# Patient Record
Sex: Female | Born: 1970 | Race: White | Marital: Married | Smoking: Never smoker
Health system: Southern US, Community
[De-identification: ages and names within clinical notes are randomized; demographics above are authoritative.]

## PROBLEM LIST (undated history)

## (undated) DIAGNOSIS — K589 Irritable bowel syndrome without diarrhea: Secondary | ICD-10-CM

## (undated) DIAGNOSIS — F329 Major depressive disorder, single episode, unspecified: Secondary | ICD-10-CM

## (undated) DIAGNOSIS — F32A Depression, unspecified: Secondary | ICD-10-CM

## (undated) DIAGNOSIS — C801 Malignant (primary) neoplasm, unspecified: Secondary | ICD-10-CM

## (undated) HISTORY — PX: COLON SURGERY: SHX602

## (undated) HISTORY — PX: CHOLECYSTECTOMY: SHX55

## (undated) HISTORY — PX: APPENDECTOMY: SHX54

---

## 2013-10-12 ENCOUNTER — Other Ambulatory Visit: Payer: Self-pay | Admitting: Family Medicine

## 2013-10-12 DIAGNOSIS — R1084 Generalized abdominal pain: Secondary | ICD-10-CM

## 2013-10-16 ENCOUNTER — Ambulatory Visit
Admission: RE | Admit: 2013-10-16 | Discharge: 2013-10-16 | Disposition: A | Discharge status: Home / Self Care | Payer: BC Managed Care – PPO | Source: Ambulatory Visit | Attending: Family Medicine | Admitting: Family Medicine

## 2013-10-16 DIAGNOSIS — R1084 Generalized abdominal pain: Secondary | ICD-10-CM

## 2013-10-16 MED ORDER — IOHEXOL 300 MG/ML  SOLN
100.0000 mL | Freq: Once | INTRAMUSCULAR | Status: AC | PRN
  Administered 2013-10-16: 100 mL via INTRAVENOUS

## 2015-07-22 DIAGNOSIS — F331 Major depressive disorder, recurrent, moderate: Secondary | ICD-10-CM | POA: Diagnosis not present

## 2015-07-22 DIAGNOSIS — L300 Nummular dermatitis: Secondary | ICD-10-CM | POA: Diagnosis not present

## 2016-09-16 DIAGNOSIS — F331 Major depressive disorder, recurrent, moderate: Secondary | ICD-10-CM | POA: Diagnosis not present

## 2016-11-27 ENCOUNTER — Encounter (HOSPITAL_BASED_OUTPATIENT_CLINIC_OR_DEPARTMENT_OTHER): Payer: Self-pay | Admitting: *Deleted

## 2016-11-27 ENCOUNTER — Emergency Department (HOSPITAL_BASED_OUTPATIENT_CLINIC_OR_DEPARTMENT_OTHER): Payer: BLUE CROSS/BLUE SHIELD

## 2016-11-27 ENCOUNTER — Emergency Department (HOSPITAL_BASED_OUTPATIENT_CLINIC_OR_DEPARTMENT_OTHER)
Admission: EM | Admit: 2016-11-27 | Discharge: 2016-11-28 | Disposition: A | Discharge status: Home / Self Care | Payer: BLUE CROSS/BLUE SHIELD | Attending: Emergency Medicine | Admitting: Emergency Medicine

## 2016-11-27 DIAGNOSIS — M5417 Radiculopathy, lumbosacral region: Secondary | ICD-10-CM

## 2016-11-27 DIAGNOSIS — Y929 Unspecified place or not applicable: Secondary | ICD-10-CM | POA: Insufficient documentation

## 2016-11-27 DIAGNOSIS — Y9301 Activity, walking, marching and hiking: Secondary | ICD-10-CM | POA: Insufficient documentation

## 2016-11-27 DIAGNOSIS — S79912A Unspecified injury of left hip, initial encounter: Secondary | ICD-10-CM | POA: Diagnosis not present

## 2016-11-27 DIAGNOSIS — S3992XA Unspecified injury of lower back, initial encounter: Secondary | ICD-10-CM | POA: Diagnosis present

## 2016-11-27 DIAGNOSIS — W108XXA Fall (on) (from) other stairs and steps, initial encounter: Secondary | ICD-10-CM | POA: Diagnosis not present

## 2016-11-27 DIAGNOSIS — Z79899 Other long term (current) drug therapy: Secondary | ICD-10-CM | POA: Insufficient documentation

## 2016-11-27 DIAGNOSIS — Y998 Other external cause status: Secondary | ICD-10-CM | POA: Insufficient documentation

## 2016-11-27 DIAGNOSIS — M5416 Radiculopathy, lumbar region: Secondary | ICD-10-CM | POA: Insufficient documentation

## 2016-11-27 DIAGNOSIS — M25552 Pain in left hip: Secondary | ICD-10-CM | POA: Diagnosis not present

## 2016-11-27 DIAGNOSIS — W19XXXA Unspecified fall, initial encounter: Secondary | ICD-10-CM

## 2016-11-27 HISTORY — DX: Major depressive disorder, single episode, unspecified: F32.9

## 2016-11-27 HISTORY — DX: Depression, unspecified: F32.A

## 2016-11-27 MED ORDER — OXYCODONE-ACETAMINOPHEN 5-325 MG PO TABS
1.0000 | ORAL_TABLET | ORAL | Status: DC | PRN
  Administered 2016-11-27: 1 via ORAL
  Filled 2016-11-27: qty 1

## 2016-11-27 MED ORDER — ONDANSETRON 4 MG PO TBDP
4.0000 mg | ORAL_TABLET | Freq: Once | ORAL | Status: AC
  Administered 2016-11-27: 4 mg via ORAL
  Filled 2016-11-27: qty 1

## 2016-11-27 NOTE — ED Triage Notes (Signed)
She fell down stairs 4 days ago. Pain got worse yesterday. She had to drive a lot today. She is ambulatory to triage and feels better to stand.

## 2016-11-28 MED ORDER — IBUPROFEN 600 MG PO TABS: 600.0000 mg | ORAL_TABLET | Freq: Three times a day (TID) | ORAL | 0 refills | Status: AC | PRN

## 2016-11-28 NOTE — ED Notes (Signed)
Pt verbalizes understanding of d/c instructions and denies any further needs at this time. 

## 2016-11-28 NOTE — ED Provider Notes (Signed)
[facility] Provider Note   CSN: [id number] Arrival date & time: 11/27/16  2030     History   Chief Complaint Chief Complaint  Patient presents with  . Fall    HPI Anita Carr is a 46 y.o. female.  The history is provided by the patient.  Fall  This is a new problem. The current episode started more than 2 days ago. Pertinent negatives include no chest pain, no abdominal pain and no headaches. Exacerbated by: sitting. The symptoms are relieved by walking.  pt reports accidentally falling over 4 days ago She was walking to bathroom after waking up and missed door and fell down steps injuring low back No head injury No LOC No cp/abd pain No neck pain   Past Medical History:  Diagnosis Date  . Depression     There are no active problems to display for this patient.   Past Surgical History:  Procedure Laterality Date  . CHOLECYSTECTOMY    . COLON SURGERY      OB History    No data available       Home Medications    Prior to Admission medications   Medication Sig Start Date End Date Taking? Authorizing Provider  BuPROPion HCl (WELLBUTRIN PO) Take by mouth.   Yes [provider]  ibuprofen (ADVIL,MOTRIN) 600 MG tablet Take 1 tablet (600 mg total) by mouth every 8 (eight) hours as needed. 11/28/16   Ripley Fraise, MD    Family History No family history on file.  Social History Social History  Substance Use Topics  . Smoking status: Never Smoker  . Smokeless tobacco: Never Used  . Alcohol use Yes     Allergies   Patient has no known allergies.   Review of Systems Review of Systems  Constitutional: Negative for fever.  Cardiovascular: Negative for chest pain.  Gastrointestinal: Negative for abdominal pain.  Musculoskeletal: Positive for back pain. Negative for neck pain.  Neurological: Negative for weakness and headaches.  All other systems reviewed and are negative.    Physical Exam Updated Vital Signs BP (!) 136/94  (BP Location: Left Arm)   Pulse 81   Temp 98.1 F (36.7 C) (Oral)   Resp 18   Ht 1.626 m (5\' 4" )   Wt 56.7 kg (125 lb)   LMP 11/23/2016   SpO2 99%   BMI 21.46 kg/m   Physical Exam CONSTITUTIONAL: Well developed/well nourished HEAD: Normocephalic/atraumatic EYES: EOMI/PERRL ENMT: Mucous membranes moist NECK: supple no meningeal signs SPINE/BACK:entire spine nontender, No bruising/crepitance/stepoffs noted to spine Mild paraspinal tenderness in lumbar region, no bruising noted CV: S1/S2 noted, no murmurs/rubs/gallops noted LUNGS: Lungs are clear to auscultation bilaterally, no apparent distress ABDOMEN: soft, nontender, no rebound or guarding, bowel sounds noted throughout abdomen GU:no cva tenderness NEURO: Pt is awake/alert/appropriate, moves all extremitiesx4.  No facial droop.  Ambulates without difficulty EXTREMITIES: pulses normal/equal, full ROM, All extremities/joints palpated/ranged and nontender SKIN: warm, color normal PSYCH: no abnormalities of mood noted, alert and oriented to situation   ED Treatments / Results  Labs (all labs ordered are listed, but only abnormal results are displayed) Labs Reviewed - No data to display  EKG  EKG Interpretation None       Radiology Dg Hip Unilat With Pelvis 2-3 Views Left  Result Date: 11/27/2016 CLINICAL DATA:  Fall with pain in the left hip EXAM: DG HIP (WITH OR WITHOUT PELVIS) 2-3V LEFT COMPARISON:  CT 10/16/2013 FINDINGS: SI joints are patent bilaterally. The pubic symphysis  is intact. The rami are within normal limits. Small calcified pelvic phleboliths. No fracture or malalignment is seen. Nonspecific soft tissue calcification adjacent to the greater trochanter. IMPRESSION: No definite acute osseous abnormality Electronically Signed   By: Donavan Foil M.D.   On: 11/27/2016 21:36    Procedures Procedures (including critical care time)  Medications Ordered in ED Medications  oxyCODONE-acetaminophen  (PERCOCET/ROXICET) 5-325 MG per tablet 1 tablet (1 tablet Oral Given 11/27/16 2055)  ondansetron (ZOFRAN-ODT) disintegrating tablet 4 mg (4 mg Oral Given 11/27/16 2338)     Initial Impression / Assessment and Plan / ED Course  I have reviewed the triage vital signs and the nursing notes.  Pertinent maging results that were available during my care of the patient were reviewed by me and considered in my medical decision making (see chart for details).     Pt reports back pain with radiation into left LE Likely radiculopathy She is ambulatory Advised f/u as outpatient We discussed return precautions   Final Clinical Impressions(s) / ED Diagnoses   Final diagnoses:  Fall, initial encounter  Lumbosacral radiculopathy    New Prescriptions New Prescriptions   IBUPROFEN (ADVIL,MOTRIN) 600 MG TABLET    Take 1 tablet (600 mg total) by mouth every 8 (eight) hours as needed.     Ripley Fraise, MD 11/28/16 [phone]

## 2016-11-28 NOTE — Discharge Instructions (Signed)

## 2016-11-28 NOTE — ED Notes (Signed)
Pt vomiting from percocet, will get zofran and move pt to hall bed to lie down

## 2016-11-28 NOTE — ED Notes (Signed)
Nausea has resolved, pt sipping on ginger ale

## 2016-11-28 NOTE — ED Notes (Signed)
Pt states she drove herself to ED. Pt informed she would need to call a ride to pick her up due to having took a percocet and is not allowed to drive after taking them. Pt is phone calling ride now.Marland Kitchen

## 2017-05-14 DIAGNOSIS — R5383 Other fatigue: Secondary | ICD-10-CM | POA: Diagnosis not present

## 2017-05-14 DIAGNOSIS — R112 Nausea with vomiting, unspecified: Secondary | ICD-10-CM | POA: Diagnosis not present

## 2017-05-14 DIAGNOSIS — F331 Major depressive disorder, recurrent, moderate: Secondary | ICD-10-CM | POA: Diagnosis not present

## 2017-05-14 DIAGNOSIS — H539 Unspecified visual disturbance: Secondary | ICD-10-CM | POA: Diagnosis not present

## 2017-06-05 ENCOUNTER — Encounter (HOSPITAL_BASED_OUTPATIENT_CLINIC_OR_DEPARTMENT_OTHER): Payer: Self-pay | Admitting: Emergency Medicine

## 2017-06-05 ENCOUNTER — Other Ambulatory Visit: Payer: Self-pay

## 2017-06-05 ENCOUNTER — Emergency Department (HOSPITAL_BASED_OUTPATIENT_CLINIC_OR_DEPARTMENT_OTHER): Payer: BLUE CROSS/BLUE SHIELD

## 2017-06-05 ENCOUNTER — Emergency Department (HOSPITAL_BASED_OUTPATIENT_CLINIC_OR_DEPARTMENT_OTHER)
Admission: EM | Admit: 2017-06-05 | Discharge: 2017-06-05 | Disposition: A | Discharge status: Home / Self Care | Payer: BLUE CROSS/BLUE SHIELD | Attending: Emergency Medicine | Admitting: Emergency Medicine

## 2017-06-05 DIAGNOSIS — R112 Nausea with vomiting, unspecified: Secondary | ICD-10-CM

## 2017-06-05 DIAGNOSIS — R1110 Vomiting, unspecified: Secondary | ICD-10-CM | POA: Diagnosis not present

## 2017-06-05 DIAGNOSIS — K529 Noninfective gastroenteritis and colitis, unspecified: Secondary | ICD-10-CM | POA: Diagnosis not present

## 2017-06-05 HISTORY — DX: Malignant (primary) neoplasm, unspecified: C80.1

## 2017-06-05 HISTORY — DX: Irritable bowel syndrome without diarrhea: K58.9

## 2017-06-05 LAB — CBC
HCT: 35.6 % — ABNORMAL LOW (ref 36.0–46.0)
HEMOGLOBIN: 12.6 g/dL (ref 12.0–15.0)
MCH: 33.5 pg (ref 26.0–34.0)
MCHC: 35.4 g/dL (ref 30.0–36.0)
MCV: 94.7 fL (ref 78.0–100.0)
Platelets: 179 10*3/uL (ref 150–400)
RBC: 3.76 MIL/uL — AB (ref 3.87–5.11)
RDW: 11.5 % (ref 11.5–15.5)
WBC: 4.6 10*3/uL (ref 4.0–10.5)

## 2017-06-05 LAB — URINALYSIS, ROUTINE W REFLEX MICROSCOPIC
BILIRUBIN URINE: NEGATIVE
Glucose, UA: NEGATIVE mg/dL
Hgb urine dipstick: NEGATIVE
Ketones, ur: 15 mg/dL — AB
Leukocytes, UA: NEGATIVE
Nitrite: NEGATIVE
Protein, ur: NEGATIVE mg/dL
pH: 6 (ref 5.0–8.0)

## 2017-06-05 LAB — COMPREHENSIVE METABOLIC PANEL
ALK PHOS: 44 U/L (ref 38–126)
ALT: 33 U/L (ref 14–54)
ANION GAP: 12 (ref 5–15)
AST: 62 U/L — ABNORMAL HIGH (ref 15–41)
Albumin: 4.4 g/dL (ref 3.5–5.0)
BILIRUBIN TOTAL: 1.1 mg/dL (ref 0.3–1.2)
BUN: 9 mg/dL (ref 6–20)
CALCIUM: 8.8 mg/dL — AB (ref 8.9–10.3)
CO2: 22 mmol/L (ref 22–32)
Chloride: 101 mmol/L (ref 101–111)
Creatinine, Ser: 0.56 mg/dL (ref 0.44–1.00)
GFR calc non Af Amer: >60 mL/min (ref >60)
Glucose, Bld: 110 mg/dL — ABNORMAL HIGH (ref 65–99)
POTASSIUM: 3.3 mmol/L — AB (ref 3.5–5.1)
Sodium: 135 mmol/L (ref 135–145)
Total Protein: 7.9 g/dL (ref 6.5–8.1)

## 2017-06-05 LAB — PREGNANCY, URINE: PREG TEST UR: NEGATIVE

## 2017-06-05 LAB — LIPASE, BLOOD: Lipase: 24 U/L (ref 11–51)

## 2017-06-05 MED ORDER — POTASSIUM CHLORIDE CRYS ER 20 MEQ PO TBCR
40.0000 meq | EXTENDED_RELEASE_TABLET | Freq: Once | ORAL | Status: AC
  Administered 2017-06-05: 40 meq via ORAL
  Filled 2017-06-05: qty 2

## 2017-06-05 MED ORDER — ONDANSETRON 4 MG PO TBDP: 4.0000 mg | ORAL_TABLET | Freq: Three times a day (TID) | ORAL | 0 refills | Status: AC | PRN

## 2017-06-05 MED ORDER — FAMOTIDINE IN NACL 20-0.9 MG/50ML-% IV SOLN
20.0000 mg | Freq: Once | INTRAVENOUS | Status: AC
Start: 2017-06-05 — End: 2017-06-05
  Administered 2017-06-05: 20 mg via INTRAVENOUS
  Filled 2017-06-05: qty 50

## 2017-06-05 MED ORDER — ONDANSETRON HCL 4 MG/2ML IJ SOLN
4.0000 mg | Freq: Once | INTRAMUSCULAR | Status: AC | PRN
  Administered 2017-06-05: 4 mg via INTRAVENOUS
  Filled 2017-06-05: qty 2

## 2017-06-05 MED ORDER — IOPAMIDOL (ISOVUE-300) INJECTION 61%
100.0000 mL | Freq: Once | INTRAVENOUS | Status: AC | PRN
Start: 2017-06-05 — End: 2017-06-05
  Administered 2017-06-05: 100 mL via INTRAVENOUS

## 2017-06-05 MED ORDER — ONDANSETRON HCL 4 MG/2ML IJ SOLN
4.0000 mg | Freq: Once | INTRAMUSCULAR | Status: AC
  Administered 2017-06-05: 4 mg via INTRAVENOUS
  Filled 2017-06-05: qty 2

## 2017-06-05 NOTE — ED Notes (Addendum)
Pt returned from [facility]. C/o continued nausea. Zofran ordered per protocol. Delay explained

## 2017-06-05 NOTE — ED Provider Notes (Signed)
[facility] EMERGENCY DEPARTMENT Provider Note   CSN: [id number] Arrival date & time: 06/05/17  1220     History   Chief Complaint Chief Complaint  Patient presents with  . Nausea  . Emesis    HPI Anita Carr is a 47 y.o. female.  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  She states that for the past 2 years or so she has had emesis intermittently.  She states that for the past week however she has been experiencing at least 3 episodes of nonbloody nonbilious emesis every morning with some improvement throughout the day.  She states that she will sometimes vomit after meals or in the middle of the night.  She notes that these symptoms are usually preceded by a feeling of anxiety and associated with "shaking all over ".  The shaking will improve after she finishes vomiting.  She denies fevers, chills, chest pain, shortness of breath, abdominal pain, or urinary symptoms.  She does have diarrhea and constipation intermittently due to her IBS but she states this is no worse than usual.  She has tried [employer] over-the-counter with some improvement in her symptoms.  She states that her primary care physician has "run blood work twice "which was all found to be normal and she has an appointment scheduled to see gastroenterology on Tuesday.  She was given Zofran in the ED which she states was helpful.  He denies unintentional weight loss.  She notes she occasionally does have sweating at night but she thinks this is related to menopause.  The history is provided by the patient.    Past Medical History:  Diagnosis Date  . Cancer ([place])   . Depression   . IBS (irritable bowel syndrome)     There are no active problems to display for this patient.   Past Surgical History:  Procedure Laterality Date  . APPENDECTOMY    .  CHOLECYSTECTOMY    . COLON SURGERY      OB History    No data available       Home Medications    Prior to Admission medications   Medication Sig Start Date End Date Taking? Authorizing Provider  BuPROPion HCl (WELLBUTRIN PO) Take by mouth.    [provider]  ibuprofen (ADVIL,MOTRIN) 600 MG tablet Take 1 tablet (600 mg total) by mouth every 8 (eight) hours as needed. 11/28/16   Ripley Fraise, MD  ondansetron (ZOFRAN ODT) 4 MG disintegrating tablet Take 1 tablet (4 mg total) by mouth every 8 (eight) hours as needed for up to 4 days for nausea or vomiting. 06/05/17 06/09/17  Renita Papa, PA-C    Family History No family history on file.  Social History Social History   Tobacco Use  . Smoking status: Never Smoker  . Smokeless tobacco: Never Used  Substance Use Topics  . Alcohol use: Yes  . Drug use: No     Allergies   Patient has no known allergies.   Review of Systems Review of Systems  Constitutional: Negative for chills and fever.  Respiratory: Negative for shortness of breath.   Cardiovascular: Negative for chest pain.  Gastrointestinal: Positive for nausea and vomiting. Negative for abdominal pain.  Psychiatric/Behavioral: The patient is nervous/anxious.   All other systems reviewed and are negative.    Physical Exam Updated Vital Signs BP (!) 142/98 (BP Location: Right Arm)  Pulse 80   Temp 98.2 F (36.8 C) (Oral)   Resp 16   Ht 5\' 4"  (1.626 m)   Wt 54.4 kg (120 lb)   LMP 05/17/2017 Comment: neg upreg  SpO2 100%   BMI 20.60 kg/m   Physical Exam  Constitutional: She appears well-developed and well-nourished. No distress.  Resting in bed, anxious in appearance with slight shaking  HENT:  Head: Normocephalic and atraumatic.  Eyes: Conjunctivae are normal. Right eye exhibits no discharge. Left eye exhibits no discharge.  Neck: Normal range of motion. Neck supple. No JVD present. No tracheal deviation present.  Cardiovascular: Normal  rate, regular rhythm and normal heart sounds.  Pulmonary/Chest: Effort normal and breath sounds normal.  Abdominal: Soft. Bowel sounds are normal. She exhibits no distension and no mass. There is no tenderness. There is no guarding.  Well-healed midline surgical incision, no CVA tenderness, bowel sounds active and present throughout.  Musculoskeletal: She exhibits no edema.  Neurological: She is alert.  Skin: Skin is warm and dry. No erythema.  Psychiatric: She has a normal mood and affect. Her behavior is normal.  Nursing note and vitals reviewed.    ED Treatments / Results  Labs (all labs ordered are listed, but only abnormal results are displayed) Labs Reviewed  COMPREHENSIVE METABOLIC PANEL - Abnormal; Notable for the following components:      Result Value   Potassium 3.3 (*)    Glucose, Bld 110 (*)    Calcium 8.8 (*)    AST 62 (*)    All other components within normal limits  CBC - Abnormal; Notable for the following components:   RBC 3.76 (*)    HCT 35.6 (*)    All other components within normal limits  URINALYSIS, ROUTINE W REFLEX MICROSCOPIC - Abnormal; Notable for the following components:   Specific Gravity, Urine >1.030 (*)    Ketones, ur 15 (*)    All other components within normal limits  LIPASE, BLOOD  PREGNANCY, URINE    EKG  EKG Interpretation None       Radiology Ct Abdomen Pelvis W Contrast  Result Date: 06/05/2017 CLINICAL DATA:  Patient with nausea and vomiting. History of carcinoid tumor with prior partial colectomy. EXAM: CT ABDOMEN AND PELVIS WITH CONTRAST TECHNIQUE: Multidetector CT imaging of the abdomen and pelvis was performed using the standard protocol following bolus administration of intravenous contrast. CONTRAST:  171mL ISOVUE-300 IOPAMIDOL (ISOVUE-300) INJECTION 61% COMPARISON:  CT abdomen pelvis 10/16/2013. FINDINGS: Lower chest: Normal heart size. Dependent atelectasis/scarring left lower lobe. Hepatobiliary: Liver is diffusely low in  attenuation suggestive of steatosis. No intrahepatic or extrahepatic biliary ductal dilatation. Patient status post cholecystectomy. Pancreas: Unremarkable Spleen: Unremarkable Adrenals/Urinary Tract: Adrenal glands are normal. Kidneys enhance symmetrically with contrast. No hydronephrosis. Urinary bladder is unremarkable. Stomach/Bowel: There is wall thickening of the sigmoid colon with associated surrounding fat stranding. Additionally there is wall thickening of the transverse colon. Postsurgical changes proximal ascending colon. No evidence for small bowel obstruction. No free fluid or free intraperitoneal air. Vascular/Lymphatic: Tortuous yet normal caliber abdominal aorta. No retroperitoneal lymphadenopathy. Reproductive: Probable fibroid uterus. Adnexal structures unremarkable. Reflux of contrast into the left gonadal vein. Other: None. Musculoskeletal: Lumbar spine degenerative changes. No aggressive or acute appearing osseous lesions. IMPRESSION: 1. Marked wall thickening of the transverse, descending and sigmoid colon most compatible with colitis. Electronically Signed   By: Lovey Newcomer M.D.   On: 06/05/2017 16:34    Procedures Procedures (including critical care time)  Medications Ordered in  ED Medications  ondansetron (ZOFRAN) injection 4 mg (4 mg Intravenous Given 06/05/17 1358)  iopamidol (ISOVUE-300) 61 % injection 100 mL (100 mLs Intravenous Contrast Given 06/05/17 1544)  ondansetron (ZOFRAN) injection 4 mg (4 mg Intravenous Given 06/05/17 1604)  famotidine (PEPCID) IVPB 20 mg premix (0 mg Intravenous Stopped 06/05/17 1704)  potassium chloride SA (K-DUR,KLOR-CON) CR tablet 40 mEq (40 mEq Oral Given 06/05/17 1708)     Initial Impression / Assessment and Plan / ED Course  I have reviewed the triage vital signs and the nursing notes.  Pertinent labs & imaging results that were available during my care of the patient were reviewed by me and considered in my medical decision making (see  chart for details).     Patient presents with nausea and vomiting for 1 year which has worsened over the past week and frequency.  She is afebrile, vital signs are stable. Abdomen is entirely nontender and benign on examination. She is anxious but nontoxic in appearance.  Lab work shows no leukocytosis, very mild hypokalemia, potassium was replenished in the ED.  This is likely consistent with her vomiting.  Remainder of lab work is unremarkable.  UA not concerning for UTI or nephrolithiasis.  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    CT scan shows marked wall thickening of the transverse, descending, and sigmoid colon compatible with colitis.  In the absence of significant diarrhea, constipation, fevers, or abdominal pain I do not think this is infectious in etiology.  She will continue taking her home IBS medications.  She has improved with Zofran and is tolerating p.o. food and fluids.  She is resting comfortably, ambulatory without difficulty.  She states she feels comfortable with discharge home.  She has follow-up with GI in 3 days.  I doubt obstruction, perforation, appendicitis, or other acute surgical abdominal pathology.  I encouraged the patient to keep her follow-up appointment with GI.  Will discharge with Zofran for nausea.  Discussed bland diet and advancing diet slowly.  Discussed indications for return to the ED. Pt verbalized understanding of and agreement with plan and is safe for discharge home at this time.  She has no complaints prior to discharge.  Final Clinical Impressions(s) / ED Diagnoses   Final diagnoses:  Non-intractable vomiting with nausea, unspecified vomiting type  Colitis    ED Discharge Orders        Ordered    ondansetron (ZOFRAN ODT) 4 MG disintegrating tablet  Every 8 hours PRN     06/05/17 1658         Renita Papa, PA-C 06/05/17 1847    Little, Wenda Overland, MD 06/09/17 2033

## 2017-06-05 NOTE — ED Notes (Signed)
Patient in CT at this time.

## 2017-06-05 NOTE — ED Notes (Signed)
ED Provider at bedside. 

## 2017-06-05 NOTE — ED Notes (Signed)
Pt c/o nausea. Raul Del, [place] notified

## 2017-06-05 NOTE — ED Notes (Signed)
Pt reports feeling like she is having a panic attack due to continuously vomiting. Pt has calm demeanor but shaking on stretcher. Pt placed on monitor

## 2017-06-05 NOTE — ED Triage Notes (Signed)
N/V x 3 days. Denies pain.

## 2017-06-05 NOTE — Discharge Instructions (Signed)
1. Medications Take Zofran as needed for nausea.  Wait around 20 minutes before eating or drinking after taking this medication. 2. Treatment: rest, drink plenty of fluids, advance diet slowly.  Start with water and broth then advance to bland foods that will not upset your stomach such as crackers, mashed potatoes, and peanut butter. 3. Follow Up: Please followup with your gastroenterologist in 3 days as scheduled for discussion of your diagnoses and further evaluation after today's visit; if you do not have a primary care doctor use the resource guide provided to find one; Please return to the ER for persistent vomiting, high fevers or worsening symptoms

## 2017-06-08 DIAGNOSIS — Z789 Other specified health status: Secondary | ICD-10-CM | POA: Diagnosis not present

## 2017-06-08 DIAGNOSIS — K529 Noninfective gastroenteritis and colitis, unspecified: Secondary | ICD-10-CM | POA: Diagnosis not present

## 2017-06-08 DIAGNOSIS — R197 Diarrhea, unspecified: Secondary | ICD-10-CM | POA: Diagnosis not present

## 2017-06-08 DIAGNOSIS — R933 Abnormal findings on diagnostic imaging of other parts of digestive tract: Secondary | ICD-10-CM | POA: Diagnosis not present

## 2017-06-28 DIAGNOSIS — K449 Diaphragmatic hernia without obstruction or gangrene: Secondary | ICD-10-CM | POA: Diagnosis not present

## 2017-06-28 DIAGNOSIS — R933 Abnormal findings on diagnostic imaging of other parts of digestive tract: Secondary | ICD-10-CM | POA: Diagnosis not present

## 2017-06-28 DIAGNOSIS — K228 Other specified diseases of esophagus: Secondary | ICD-10-CM | POA: Diagnosis not present

## 2017-06-28 DIAGNOSIS — K5730 Diverticulosis of large intestine without perforation or abscess without bleeding: Secondary | ICD-10-CM | POA: Diagnosis not present

## 2017-06-28 DIAGNOSIS — K229 Disease of esophagus, unspecified: Secondary | ICD-10-CM | POA: Diagnosis not present

## 2017-06-28 DIAGNOSIS — R197 Diarrhea, unspecified: Secondary | ICD-10-CM | POA: Diagnosis not present

## 2017-06-28 DIAGNOSIS — K2930 Chronic superficial gastritis without bleeding: Secondary | ICD-10-CM | POA: Diagnosis not present

## 2017-07-01 DIAGNOSIS — K228 Other specified diseases of esophagus: Secondary | ICD-10-CM | POA: Diagnosis not present

## 2017-07-01 DIAGNOSIS — R197 Diarrhea, unspecified: Secondary | ICD-10-CM | POA: Diagnosis not present

## 2017-07-01 DIAGNOSIS — K2930 Chronic superficial gastritis without bleeding: Secondary | ICD-10-CM | POA: Diagnosis not present

## 2017-07-19 DIAGNOSIS — R197 Diarrhea, unspecified: Secondary | ICD-10-CM | POA: Diagnosis not present

## 2017-07-19 DIAGNOSIS — Z789 Other specified health status: Secondary | ICD-10-CM | POA: Diagnosis not present

## 2017-07-19 DIAGNOSIS — K219 Gastro-esophageal reflux disease without esophagitis: Secondary | ICD-10-CM | POA: Diagnosis not present

## 2018-07-02 IMAGING — CT CT ABD-PELV W/ CM
2 of 5 series · 16 of 46 positions shown, 18 images · IV contrast (APPLIED)
Comparison: CT abdomen pelvis 10/16/2013.

CLINICAL DATA: Patient with nausea and vomiting. History of
carcinoid tumor with prior partial colectomy.

EXAM:
CT ABDOMEN AND PELVIS WITH CONTRAST
TECHNIQUE: Multidetector CT imaging of the abdomen and pelvis was performed
using the standard protocol following bolus administration of
intravenous contrast.
CONTRAST:  100mL [id number] IOPAMIDOL ([id number]) INJECTION 61%

[Series 2: axial st · axial · 0.67mm/px · z∈[+530,+885]mm · 13 of 81 slices shown, 15 images]
[im 5/81  soft-tissue]
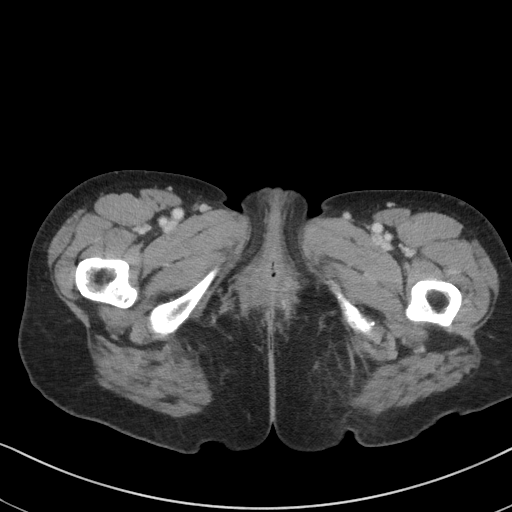
[im 5/81  bone]
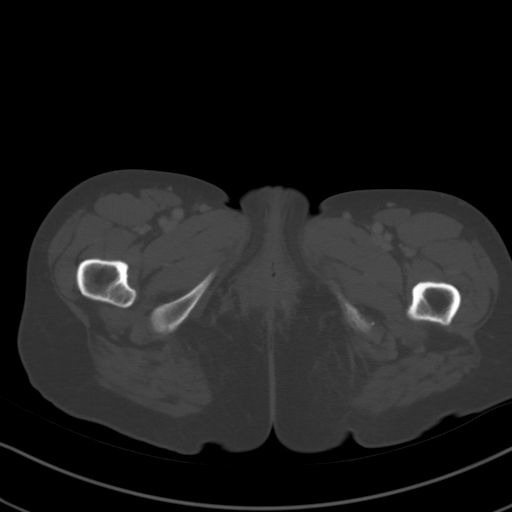
[im 10/81  soft-tissue]
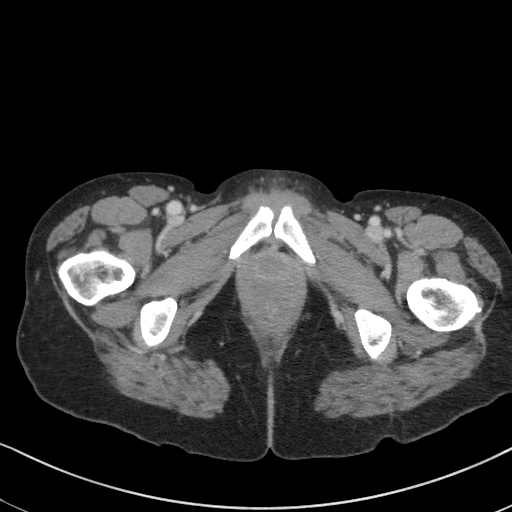
[im 19/81  soft-tissue]
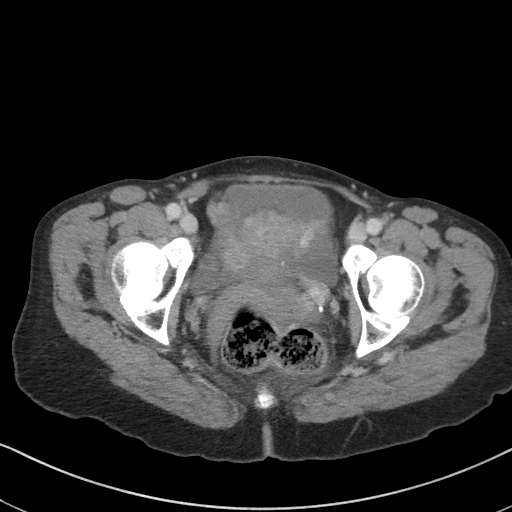
[im 24/81  soft-tissue]
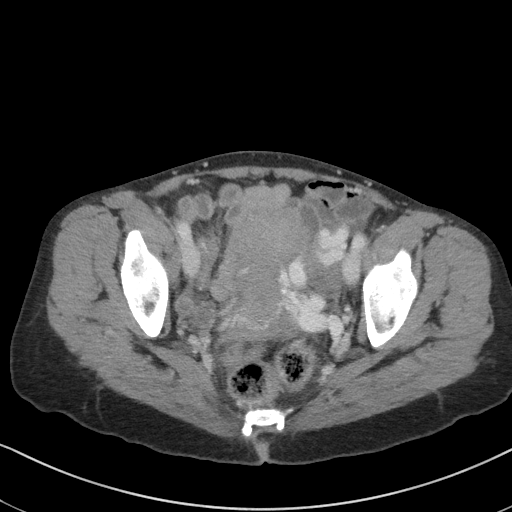
[im 29/81  soft-tissue]
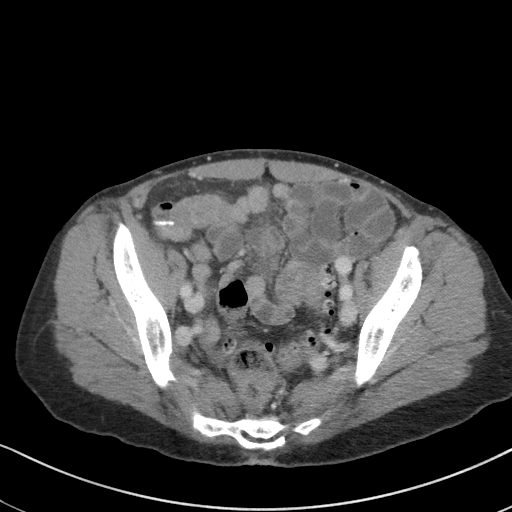
[im 33/81  soft-tissue]
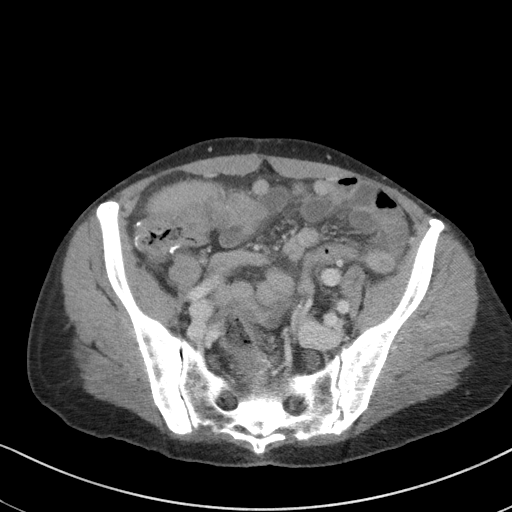
[im 43/81  soft-tissue]
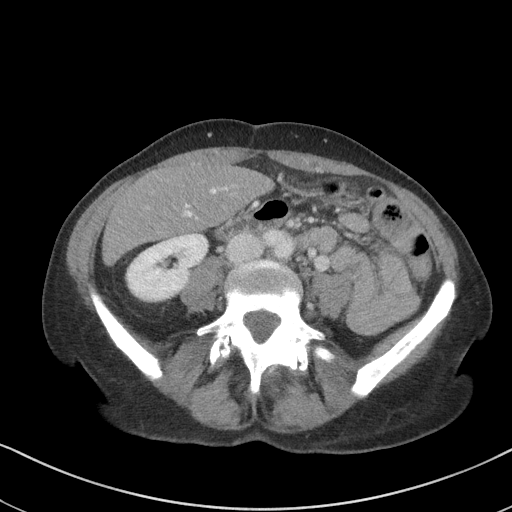
[im 48/81  soft-tissue]
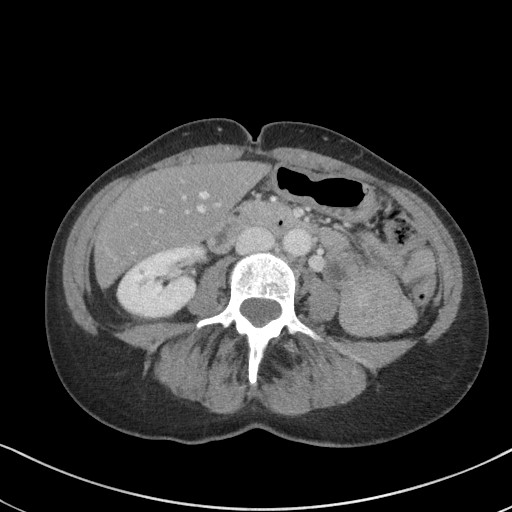
[im 52/81  soft-tissue]
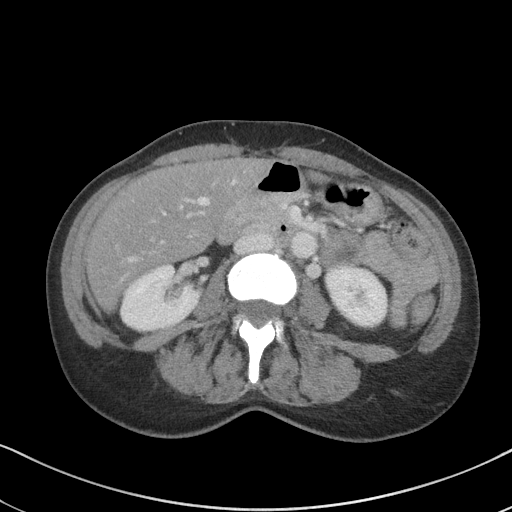
[im 52/81  bone]
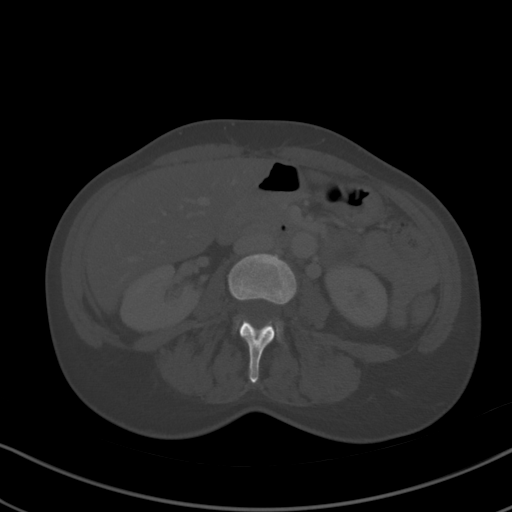
[im 57/81  soft-tissue]
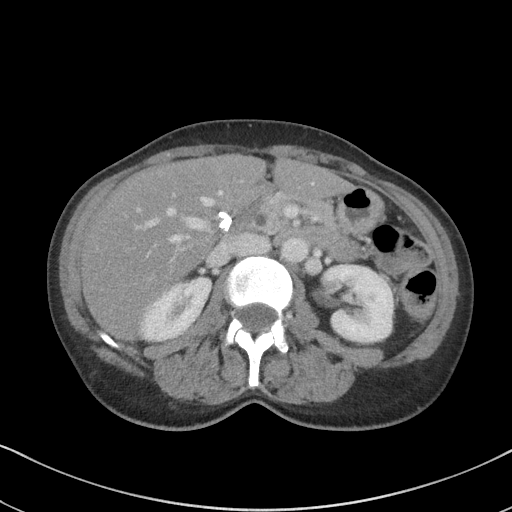
[im 62/81  soft-tissue]
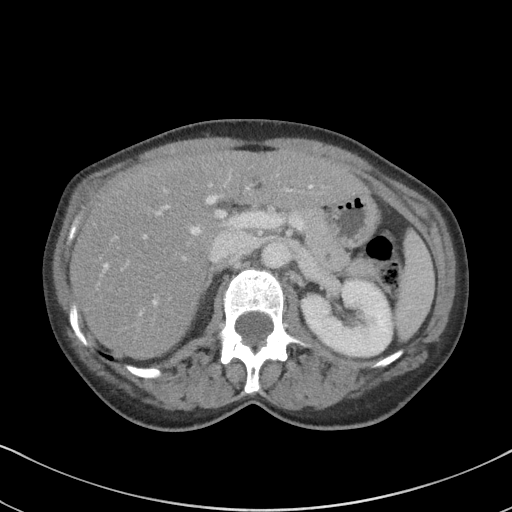
[im 71/81  soft-tissue]
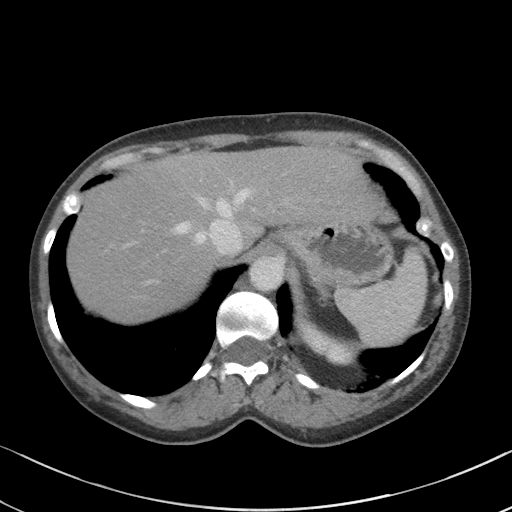
[im 76/81  soft-tissue]
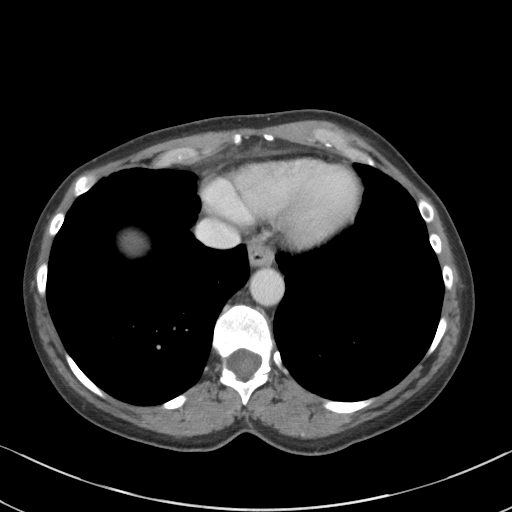

[Series 5: coronal st · coronal · 0.67mm/px · 3 of 79 slices shown]
[im 27/79  soft-tissue]
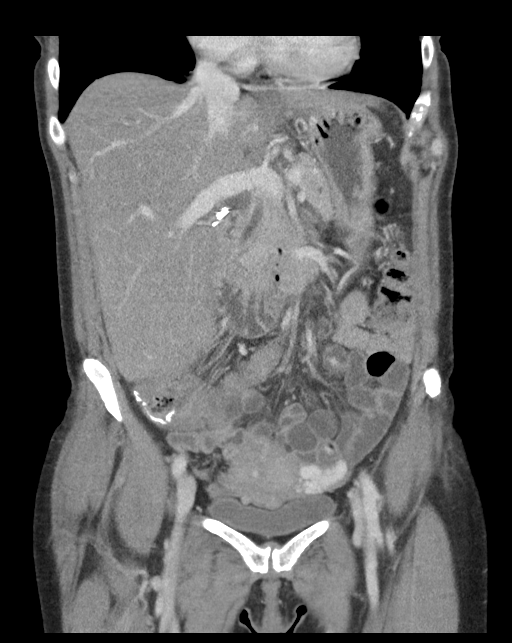
[im 35/79  soft-tissue]
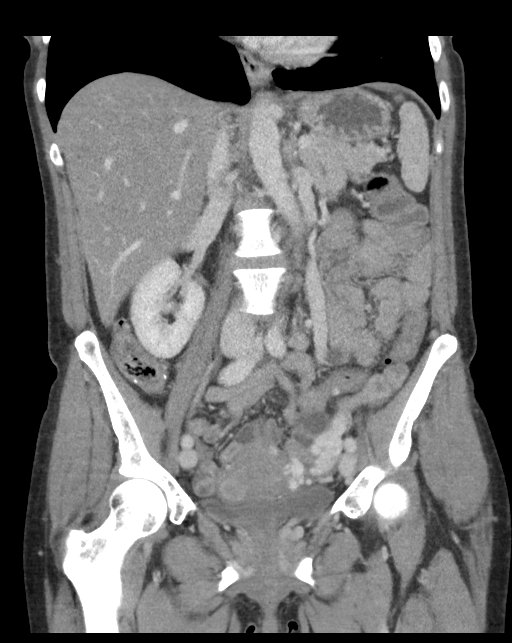
[im 44/79  soft-tissue]
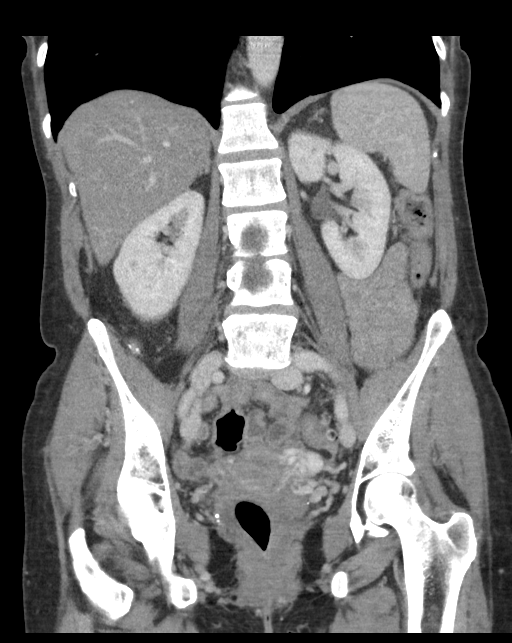

[16 of 46 positions shown; findings below may reference images not displayed]

FINDINGS: Lower chest: Normal heart size. Dependent atelectasis/scarring left
lower lobe.

Hepatobiliary: Liver is diffusely low in attenuation suggestive of
steatosis. No intrahepatic or extrahepatic biliary ductal
dilatation. Patient status post cholecystectomy.

Pancreas: Unremarkable

Spleen: Unremarkable

Adrenals/Urinary Tract: Adrenal glands are normal. Kidneys enhance
symmetrically with contrast. No hydronephrosis. Urinary bladder is
unremarkable.

Stomach/Bowel: There is wall thickening of the sigmoid colon with
associated surrounding fat stranding. Additionally there is wall
thickening of the transverse colon. Postsurgical changes proximal
ascending colon. No evidence for small bowel obstruction. No free
fluid or free intraperitoneal air.

Vascular/Lymphatic: Tortuous yet normal caliber abdominal aorta. No
retroperitoneal lymphadenopathy.

Reproductive: Probable fibroid uterus. Adnexal structures
unremarkable. Reflux of contrast into the left gonadal vein.

Other: None.

Musculoskeletal: Lumbar spine degenerative changes. No aggressive or
acute appearing osseous lesions.
IMPRESSION: 1. Marked wall thickening of the transverse, descending and sigmoid
colon most compatible with colitis.
